# Patient Record
Sex: Male | Born: 1985 | Hispanic: No | Marital: Married | Smoking: Never smoker
Health system: Southern US, Community
[De-identification: ages and names within clinical notes are randomized; demographics above are authoritative.]

---
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
## 2008-08-02 ENCOUNTER — Ambulatory Visit: Payer: Self-pay | Admitting: Gynecology

## 2013-03-09 ENCOUNTER — Inpatient Hospital Stay: Payer: Self-pay | Admitting: Internal Medicine

## 2013-03-09 LAB — CBC WITH DIFFERENTIAL/PLATELET
Basophil #: 0 10*3/uL (ref 0.0–0.1)
Eosinophil #: 0.1 10*3/uL (ref 0.0–0.7)
Eosinophil %: 1 %
HGB: 13.9 g/dL (ref 13.0–18.0)
Lymphocyte #: 0.5 10*3/uL — ABNORMAL LOW (ref 1.0–3.6)
Lymphocyte %: 4.3 %
MCH: 30.1 pg (ref 26.0–34.0)
MCHC: 33.4 g/dL (ref 32.0–36.0)
Monocyte %: 3 %
Neutrophil #: 10.5 10*3/uL — ABNORMAL HIGH (ref 1.4–6.5)
Neutrophil %: 91.6 %
RBC: 4.62 10*6/uL (ref 4.40–5.90)
RDW: 12.9 % (ref 11.5–14.5)

## 2013-03-09 LAB — BASIC METABOLIC PANEL: BUN: 17 mg/dL (ref 7–18)

## 2013-03-10 LAB — BASIC METABOLIC PANEL
BUN: 15 mg/dL (ref 7–18)
Calcium, Total: 8.5 mg/dL (ref 8.5–10.1)
Chloride: 104 mmol/L (ref 98–107)
Creatinine: 1.11 mg/dL (ref 0.60–1.30)
EGFR (African American): >60
Glucose: 95 mg/dL (ref 65–99)
Osmolality: 273 (ref 275–301)
Potassium: 3.8 mmol/L (ref 3.5–5.1)

## 2013-03-10 LAB — CBC WITH DIFFERENTIAL/PLATELET
Basophil %: 0.3 %
Eosinophil #: 0.3 10*3/uL (ref 0.0–0.7)
HGB: 13.3 g/dL (ref 13.0–18.0)
Lymphocyte #: 0.7 10*3/uL — ABNORMAL LOW (ref 1.0–3.6)
Lymphocyte %: 11 %
MCH: 30.6 pg (ref 26.0–34.0)
MCHC: 34.2 g/dL (ref 32.0–36.0)
MCV: 89 fL (ref 80–100)
Neutrophil #: 5.3 10*3/uL (ref 1.4–6.5)
Neutrophil %: 80.7 %
RBC: 4.34 10*6/uL — ABNORMAL LOW (ref 4.40–5.90)
RDW: 12.8 % (ref 11.5–14.5)
WBC: 6.6 10*3/uL (ref 3.8–10.6)

## 2013-03-11 LAB — VANCOMYCIN, TROUGH: Vancomycin, Trough: 17 ug/mL (ref 10–20)

## 2013-03-15 LAB — CULTURE, BLOOD (SINGLE)

## 2015-03-20 NOTE — H&P (Signed)
DATE OF BIRTH:  March 22, 1986  DATE OF ADMISSION:  03/09/2013  PRIMARY CARE PROVIDER:  None.   ED REFERRING PHYSICIAN:  Dr. Marge Duncans  REASON FOR ADMISSION:  Right eye swelling, erythema around his right eye.   HISTORY OF PRESENT ILLNESS: The patient is a 29 year old white male who has a history of seasonal allergies. He reports that last Thursday evening he started having swelling around his right eye in the upper and lower eyelids. He also developed some erythema. He went to the walk-in clinic at [facility] and was prescribed Augmentin. He also received an IM shot, likely Rocephin. The patient's symptoms continued to get worse and the swelling continued to worsen. He again went back to [facility] yesterday, and was given again Rocephin and Bactrim was added to his regimen. His swelling continued to get worse. He comes to the ED now. The swelling has gotten worse around his orbits and he is not able to open his eyes completely. When he is able to open his eyelids, he is able to see fine. He does not have any eye pain or double vision. He has not had any fevers or chills. He otherwise has no other complaints.   PAST MEDICAL HISTORY:  Chronic seasonal allergies.   PAST SURGICAL HISTORY:  None.   ALLERGIES:  None.   CURRENT MEDICATIONS:  He is only on Claritin 10 once a day and the antibiotic that he was prescribed.   SOCIAL HISTORY:  Does not smoke. Drinks socially. No drugs.   FAMILY HISTORY:  Negative for hypertension or diabetes.   REVIEW OF SYSTEMS:    CONSTITUTIONAL:  Denies any fevers, fatigue, weakness, pain. No weight loss or weight gain.  EYES:  No blurred or double vision. No pain. No redness in the eye itself. Has inflammation around the eye. No glaucoma. No cataracts.  EARS, NOSE, THROAT: No tinnitus. No ear pain. No hearing loss. Does have seasonal allergies. No difficulty swallowing.  RESPIRATORY:  No cough. No wheezing. No hemoptysis.  CARDIOVASCULAR:  No chest  pain. No orthopnea. No edema. No arrhythmias.  GASTROINTESTINAL:  No nausea, vomiting, diarrhea. No abdominal pain. No hematemesis. No melena.  GENITOURINARY: Denies any dysuria, hematuria, renal calculus or frequency.  ENDOCRINOLOGIC:  Denies any polydpsia and polyphagia, nocturia, or thyroid problems.  HEMATOLOGIC/LYMPHATIC: Denies any easy bruisability or bleeding.  SKIN:  No acne. Complains of erythema around his right eye.  MUSCULOSKELETAL: No pain in neck, back or shoulder.  NEUROLOGIC:  No numbness. No CVA. No TIA. No seizures.  PSYCHIATRIC:  No anxiety. No insomnia. No ADD. No OCD.   PHYSICAL EXAMINATION: VITAL SIGNS:  Temperature 97.8, pulse 77, respirations 20, blood pressure 136/85, O2 is 98%.  GENERAL:  The patient is a well-developed, well-nourished male in no acute distress.  HEENT: Head atraumatic, normocephalic. Right eye: There is periorbital swelling and erythema. Pupils are equally round, react to light and accommodation. Visual confrontation is limited due to eye swelling, but he denies any blurred or double vision on examination. Nasal exam shows no drainage or ulceration. Oropharynx is clear, without any exudate.  NECK:  No thyromegaly. No carotid bruits.  CARDIOVASCULAR:  Regular rate and rhythm. No murmurs, rubs, clicks or gallops. PMI is not displaced.  LUNGS:  Clear to auscultation bilaterally without any rales, rhonchi, wheezing. No accessory muscle usage.  ABDOMEN:  Soft, nontender, nondistended. Positive bowel sounds x 4.  EXTREMITIES: No clubbing, cyanosis, edema.  SKIN:  Besides the erythema and swelling around his right  orbit, no other rash.  LYMPHATICS:  No lymph nodes palpable.  VASCULAR:  Good DP/ PT pulses.  PSYCHIATRIC:  Not anxious or depressed.  NEUROLOGIC:  Awake, alert, oriented x 3. No focal deficits.   LABS:  Glucose 143, BUN 17, creatinine 1.09, sodium 134, potassium 3.6, chloride 101, CO2 is 24. WBC 11.4, hemoglobin 13.9, platelet count 174.    ASSESSMENT AND PLAN:  The patient is a 29 year old with severe right periorbital cellulitis.   1. Periorbital cellulitis, which is severe at this time. I will treat him with Unasyn and vancomycin. Also obtain blood cultures.   2.  Hyperglycemia. Will check a hemoglobin A1c.    3.  Seasonal allergies. Will continue his Claritin as taking at home.   4.  Miscellaneous. Patient is ambulatory.    NOTE:  35 minutes spent.   ____________________________ Lacie ScottsShreyang H. Allena KatzPatel, MD shp:mr D: 03/09/2013 20:25:51 ET T: 03/09/2013 21:16:13 ET JOB#: [id number]  cc: Maximillian Habibi H. Allena KatzPatel, MD, <Dictator> Charise CarwinSHREYANG H Suvi Archuletta MD ELECTRONICALLY SIGNED 03/12/2013 20:32

## 2015-03-20 NOTE — Discharge Summary (Signed)
PATIENT NAME:  Derek Sutton, Derek Sutton MR#:  [id number] DATE OF BIRTH:  [birth year]  DATE OF ADMISSION:  03/09/2013 DATE OF DISCHARGE:  03/11/2013   ADMITTING PHYSICIAN: Shreyang H. Allena KatzPatel, MD  DISCHARGING PHYSICIAN: Enid Baasadhika Charish Schroepfer, MD  PRIMARY CARE PHYSICIAN: None.   CONSULTATIONS IN THE HOSPITAL: None.   DISCHARGE DIAGNOSES:  1. Right eye periorbital cellulitis.  2. Perennial allergies.   DISCHARGE HOME MEDICATIONS:  1. Claritin 10 mg p.o. daily.  2. Norco 5/325 mg 1 tablet q.4 hours p.r.n. for pain.  3. Tobramycin ophthalmic ointment 1 drop to right eye every 4 hours.  4. Augmentin 875 mg 1 tablet p.o. b.i.d. for 7 days.  5. Bactrim double strength 1 tablet p.o. t.i.d. for 7 days.   DISCHARGE DIET: Regular diet.   DISCHARGE ACTIVITY: As tolerated.   FOLLOW-UP INSTRUCTIONS: Advised to follow up at urgent care if swelling of the right eye does not improve. Advised to apply ice pack as tolerated.   LABS AND IMAGING STUDIES PRIOR TO DISCHARGE:  1. WBC 6.6, hemoglobin 13.3, hematocrit 38.8, platelet count 159.  2. Sodium 136, potassium 3.8, chloride 104, bicarbonate 27, BUN 15, creatinine 1.1, glucose 95 and calcium of 8.5. HbA1c is 5.1.  3. Blood cultures are negative.   BRIEF HOSPITAL COURSE: The patient is a pleasant 29 year old Caucasian male with no past medical history, who presented to the urgent care a couple of days prior to admission to the hospital secondary to redness around the right eye. He had like a small pustule close to the medial canthus of the right eye which he was rubbing and probably spread the infection. He was started on Augmentin twice a day. He did take it for a day and had worsening of symptoms, went back to urgent care and was given Bactrim as well. He took 1 dose of Bactrim, but he was concerned since his face was swollen and eye was closed and came to the hospital.   Right eye periorbital and facial cellulitis. Eye was closed shut because of the swelling and  also had mild conjunctival irritation as well. He was started on vancomycin and Unasyn while in the hospital. White count was normal. His symptoms improved after 48 hours of IV antibiotics and is being discharged on Bactrim and Augmentin to cover for the common organisms, including MRSA. His facial swelling still persisted beneath the eye, but his upper eyelid swelling has improved, and conjunctival erythema has cleared. He was also started on tobramycin eyedrops for the same. Since he does not have a PCP, he was advised to apply ice as needed and practice proper hygiene and not irritate the eye. If his clinical condition worsens or changes, advised to go back to the urgent care. His course has been otherwise uneventful in the hospital.   TIME SPENT ON DISCHARGE: 40 minutes.  ____________________________ Enid Baasadhika Jakara Blatter, MD rk:OSi D: 03/12/2013 12:34:54 ET T: 03/12/2013 12:45:44 ET JOB#: [id number]  cc: Enid Baasadhika Dontavis Tschantz, MD, <Dictator> Enid BaasADHIKA Brandan Robicheaux MD ELECTRONICALLY SIGNED 03/15/2013 15:53

## 2015-08-05 ENCOUNTER — Other Ambulatory Visit: Payer: Self-pay | Admitting: Family Medicine

## 2015-08-05 ENCOUNTER — Ambulatory Visit
Admission: RE | Admit: 2015-08-05 | Discharge: 2015-08-05 | Disposition: A | Discharge status: Home / Self Care | Payer: 59 | Source: Ambulatory Visit | Attending: Family Medicine | Admitting: Family Medicine

## 2015-08-05 DIAGNOSIS — N50819 Testicular pain, unspecified: Secondary | ICD-10-CM

## 2015-08-05 DIAGNOSIS — N433 Hydrocele, unspecified: Secondary | ICD-10-CM | POA: Insufficient documentation

## 2015-08-05 DIAGNOSIS — N508 Other specified disorders of male genital organs: Secondary | ICD-10-CM | POA: Insufficient documentation

## 2015-09-09 IMAGING — US US ART/VEN ABD/PELV/SCROTUM DOPPLER LTD
1 series · 13 of 25 positions shown · non-contrast
Comparison: None.

CLINICAL DATA: Right testicular pain for 3 days. No known injury.
Initial encounter.

EXAM:
SCROTAL ULTRASOUND
DOPPLER ULTRASOUND OF THE TESTICLES
TECHNIQUE: Complete ultrasound examination of the testicles, epididymis, and
other scrotal structures was performed. Color and spectral Doppler
ultrasound were also utilized to evaluate blood flow to the
testicles.

[Series 1: us art/ven abd/pelv/scrotum doppler ltd · 0.08mm/px · 13 of 43 slices shown]
[im 1/43]
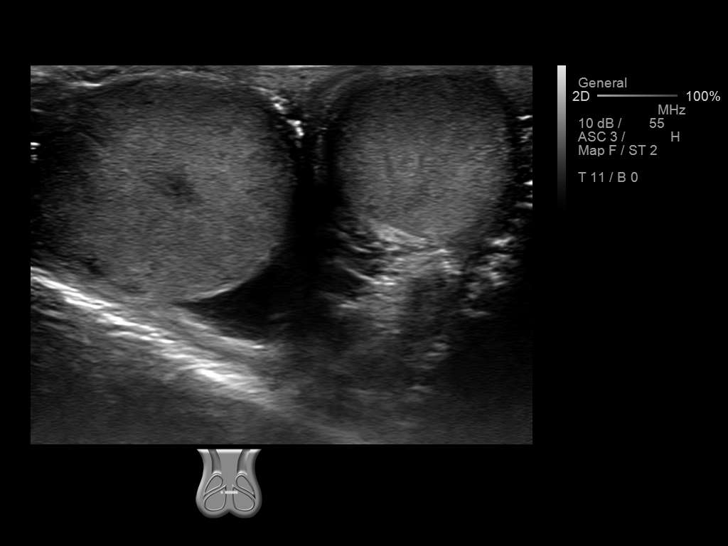
[im 4/43]
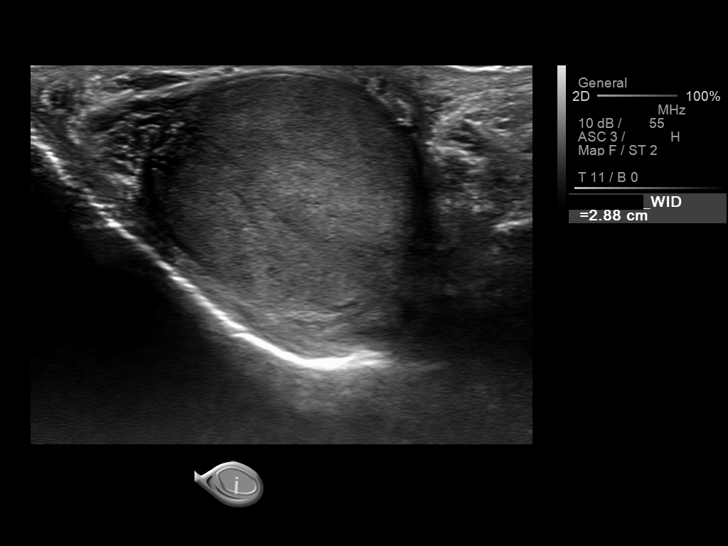
[im 8/43]
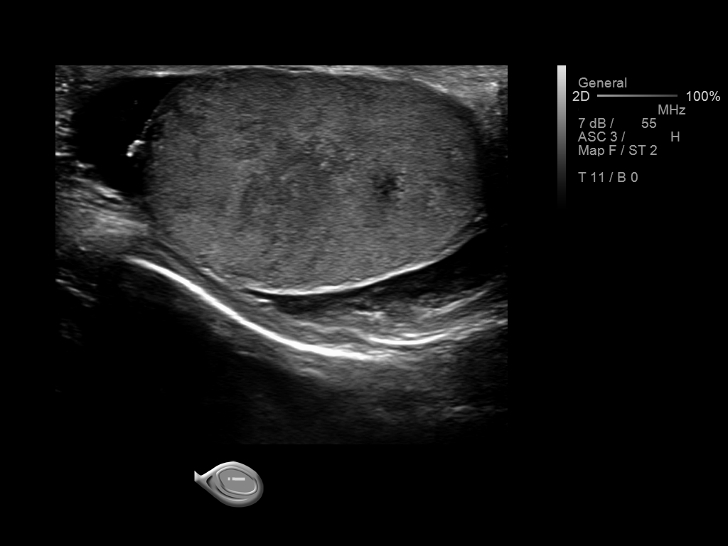
[im 11/43]
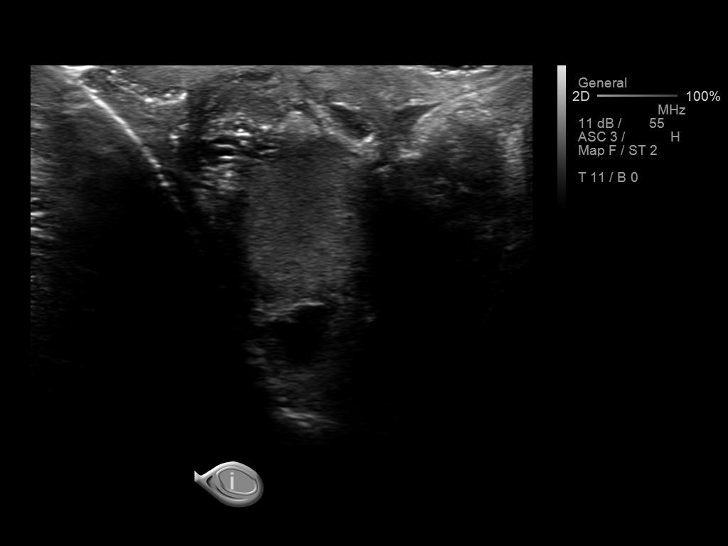
[im 15/43]
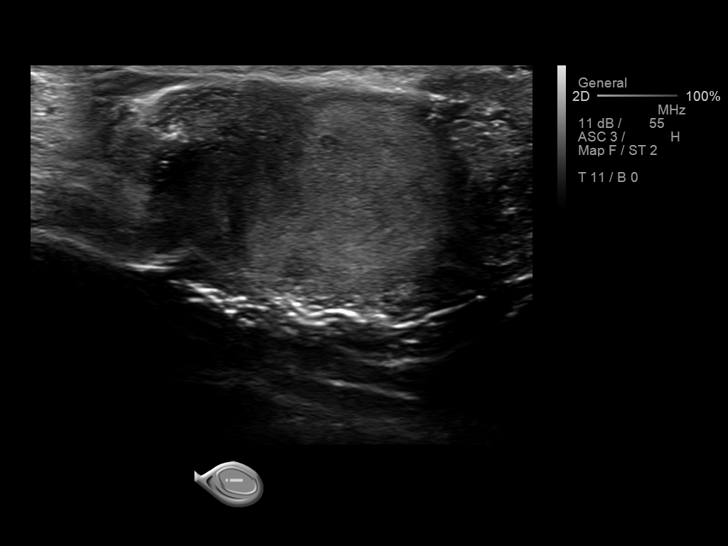
[im 18/43]
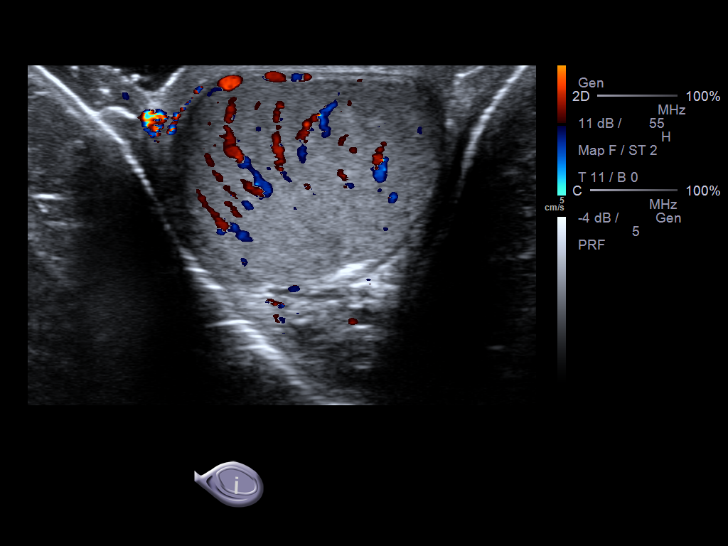
[im 22/43]
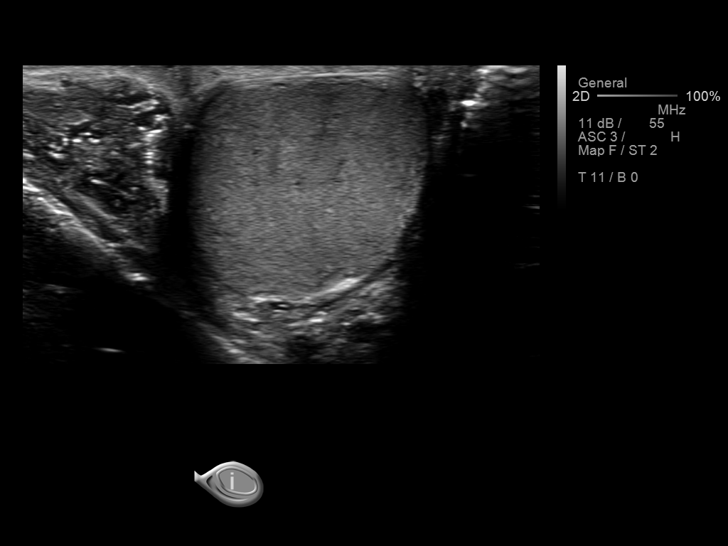
[im 25/43]
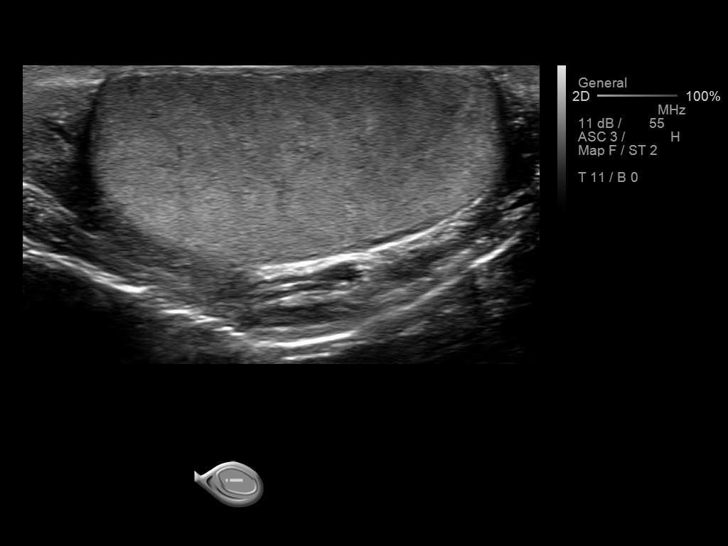
[im 29/43]
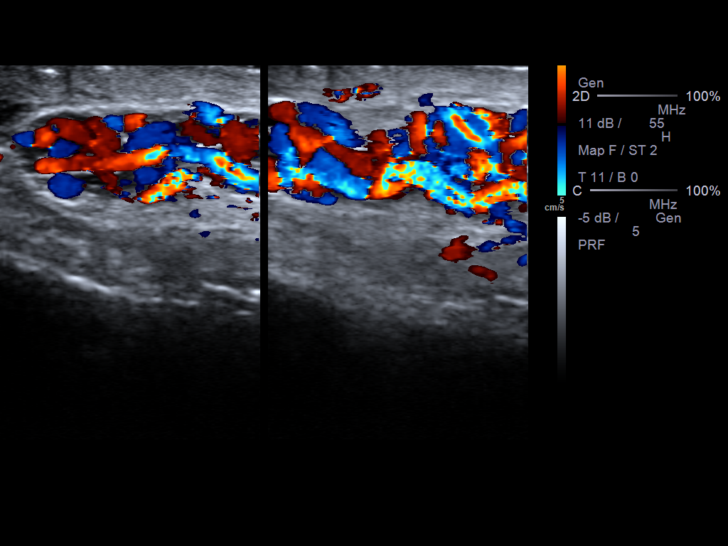
[im 32/43]
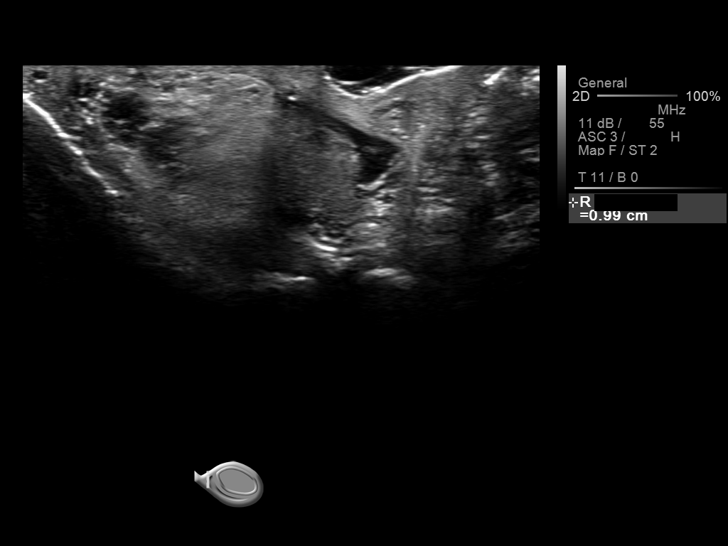
[im 36/43]
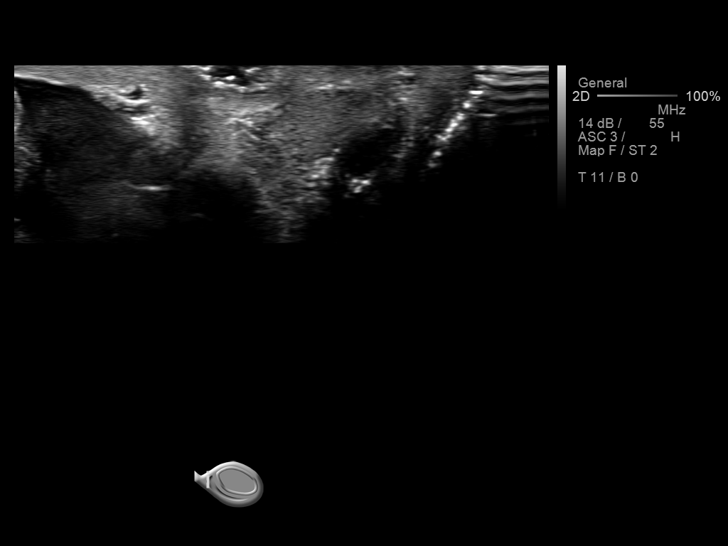
[im 39/43]
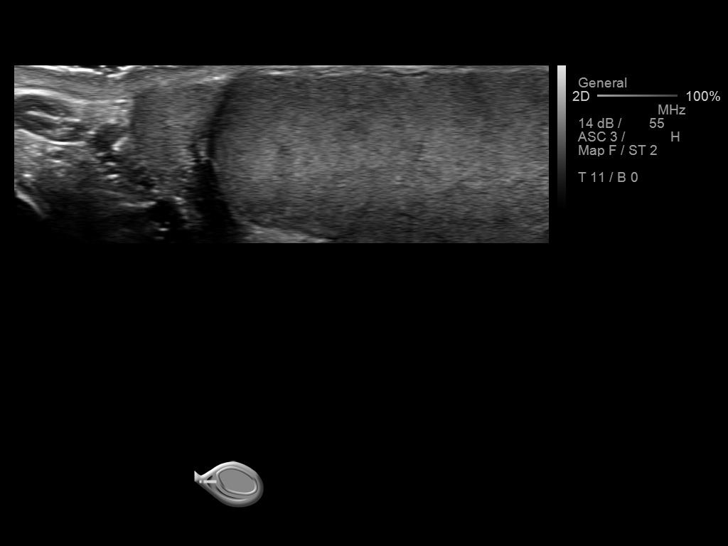
[im 43/43]
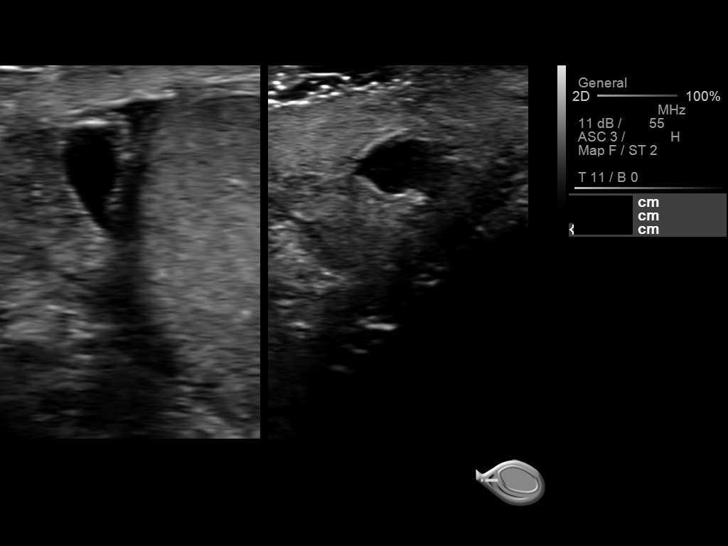

[13 of 25 positions shown; findings below may reference images not displayed]

FINDINGS: Right testicle

Measurements: 4.2 x 2.4 x 2.9 cm. The right testicle has a
heterogeneous appearance and is hyperemic relative to the left.

Left testicle

Measurements: 4.5 x 2.0 x 2.8 cm. No mass or microlithiasis
visualized.

Right epididymis: The right epididymis may be mildly hyperemic
relative to the left.

Left epididymis: Simple cyst in the left epididymis measures 0.6 cm
in diameter.

Hydrocele:  Small right hydrocele is seen.  No left hydrocele.

Varicocele:  None visualized.

Pulsed Doppler interrogation of both testes demonstrates normal low
resistance arterial and venous waveforms bilaterally.
IMPRESSION: Abnormal appearance of the right testicle is most consistent with
orchitis. There may also be epididymitis on the right. Associated
small right hydrocele is noted.

## 2016-10-04 ENCOUNTER — Encounter: Payer: Self-pay | Admitting: Emergency Medicine

## 2016-10-04 ENCOUNTER — Emergency Department
Admission: EM | Admit: 2016-10-04 | Discharge: 2016-10-04 | Disposition: A | Discharge status: Home / Self Care | Payer: 59 | Attending: Emergency Medicine | Admitting: Emergency Medicine

## 2016-10-04 ENCOUNTER — Emergency Department: Payer: 59

## 2016-10-04 DIAGNOSIS — M545 Low back pain: Secondary | ICD-10-CM | POA: Diagnosis present

## 2016-10-04 DIAGNOSIS — Z79899 Other long term (current) drug therapy: Secondary | ICD-10-CM | POA: Insufficient documentation

## 2016-10-04 DIAGNOSIS — Z791 Long term (current) use of non-steroidal anti-inflammatories (NSAID): Secondary | ICD-10-CM | POA: Diagnosis not present

## 2016-10-04 DIAGNOSIS — M5441 Lumbago with sciatica, right side: Secondary | ICD-10-CM | POA: Diagnosis not present

## 2016-10-04 DIAGNOSIS — M5442 Lumbago with sciatica, left side: Secondary | ICD-10-CM | POA: Diagnosis not present

## 2016-10-04 MED ORDER — MELOXICAM 15 MG PO TABS
15.0000 mg | ORAL_TABLET | Freq: Every day | ORAL | 0 refills | Status: AC
Start: 2016-10-04 — End: 2017-10-04

## 2016-10-04 MED ORDER — LIDOCAINE 5 % EX PTCH: 1.0000 | MEDICATED_PATCH | Freq: Two times a day (BID) | CUTANEOUS | 0 refills | Status: AC

## 2016-10-04 MED ORDER — LIDOCAINE 5 % EX PTCH
1.0000 | MEDICATED_PATCH | CUTANEOUS | Status: DC
  Administered 2016-10-04: 1 via TRANSDERMAL
  Filled 2016-10-04: qty 1

## 2016-10-04 NOTE — ED Triage Notes (Signed)
Patient ambulatory to triage with steady gait, without difficulty or distress noted; pt reports lower back pain with hx of same; st has a referral to an orthopedist but pain increased tonight

## 2016-10-04 NOTE — ED Notes (Signed)
Patient transported to X-ray at this time 

## 2016-10-04 NOTE — ED Provider Notes (Signed)
[facility] Emergency Department Provider Note   ____________________________________________   First MD Initiated Contact with Patient 10/04/16 0622     (approximate)  I have reviewed the triage vital signs and the nursing notes.   HISTORY  Chief Complaint Back Pain    HPI Derek Sutton is a 30 y.o. male who comes into the hospital today with some back pain. The patient has been having 6 weeks of low back pain. Initially the pain was intermittent but 3 weeks ago it became constant. 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 He saw his doctor and was given prednisone and naproxen but it did not really help. Over the weekend the pain seemed to get worse. It was radiating down both of his legs. The patient went to urgent care and was given some Vicodin and Flexeril. He was told to follow-up with orthopedic surgery but they have not yet called him. Tonight he woke up with pain around one. It was excruciating. He was able to fall back asleep and then woke up again at 4 and could not move. The patient took 2 Vicodin as well as a Flexeril before the pain would subside. The patient denies any trauma. At home his pain was a 10 out of 10 but currently his pain is a 2-3 out of 10 in intensity. The patient has no urinary incontinence no fecal incontinence no saddle anesthesia. The patient is concerned about what is going on and is here for evaluation.   History reviewed. No pertinent past medical history.  There are no active problems to display for this patient.   History reviewed. No pertinent surgical history.  Prior to Admission medications   Medication Sig Start Date End Date Taking? Authorizing Provider  lidocaine (LIDODERM) 5 % Place 1 patch onto the skin every 12 (twelve) hours. Remove & Discard patch within 12 hours or as directed by MD 10/04/16 10/04/17  Rebecka ApleyAllison P Webster, MD  meloxicam (MOBIC) 15 MG tablet Take 1 tablet  (15 mg total) by mouth daily. 10/04/16 10/04/17  Rebecka ApleyAllison P Webster, MD    Allergies Patient has no known allergies.  No family history on file.  Social History Social History  Substance Use Topics  . Smoking status: Never Smoker  . Smokeless tobacco: Never Used  . Alcohol use No    Review of Systems Constitutional: No fever/chills Eyes: No visual changes. ENT: No sore throat. Cardiovascular: Denies chest pain. Respiratory: Denies shortness of breath. Gastrointestinal: No abdominal pain.  No nausea, no vomiting.  No diarrhea.  No constipation. Genitourinary: Negative for dysuria. Musculoskeletal:  back pain. Skin: Negative for rash. Neurological: Negative for headaches, focal weakness or numbness.  10-point ROS otherwise negative.  ____________________________________________   PHYSICAL EXAM:  VITAL SIGNS: ED Triage Vitals  Enc Vitals Group     BP 10/04/16 0542 126/88     Pulse Rate 10/04/16 0542 71     Resp 10/04/16 0542 20     Temp 10/04/16 0542 97.4 F (36.3 C)     Temp Source 10/04/16 0542 Oral     SpO2 10/04/16 0542 97 %     Weight 10/04/16 0538 215 lb (97.5 kg)     Height 10/04/16 0538 6' (1.829 m)     Head Circumference --      Peak Flow --      Pain Score 10/04/16 0538 10     Pain Loc --  Pain Edu? --      Excl. in GC? --     Constitutional: Alert and oriented. Well appearing and in no acute distress. Eyes: Conjunctivae are normal. PERRL. EOMI. Head: Atraumatic. Nose: No congestion/rhinnorhea. Mouth/Throat: Mucous membranes are moist.  Oropharynx non-erythematous. Cardiovascular: Normal rate, regular rhythm. Grossly normal heart sounds.  Good peripheral circulation. Respiratory: Normal respiratory effort.  No retractions. Lungs CTAB. Gastrointestinal: Soft and nontender. No distention.Positive bowel sounds Musculoskeletal: No lower extremity tenderness nor edema.   Neurologic:  Normal speech and language.  Skin:  Skin is warm, dry and intact.    Psychiatric: Mood and affect are normal.   ____________________________________________   LABS (all labs ordered are listed, but only abnormal results are displayed)  Labs Reviewed - No data to display ____________________________________________  EKG  none ____________________________________________  RADIOLOGY  Lumbar spine x-ray ____________________________________________   PROCEDURES  Procedure(s) performed: None  Procedures  Critical Care performed: No  ____________________________________________   INITIAL IMPRESSION / ASSESSMENT AND PLAN / ED COURSE  Pertinent labs & imaging results that were available during my care of the patient were reviewed by me and considered in my medical decision making (see chart for details).  This is a 30 year old male who comes into the hospital today with some back pain. The patient did take some medication prior to coming in this pain is improved. He was asking if there was any imaging to help figure out what was going on. I did inform him that since he does not have any weakness or saddle anesthesia we cannot do an MRI emergently but I can do an x-ray to evaluate for disc space narrowing or arthritis.  Clinical Course as of Oct 04 746  Tue Oct 04, 2016  [id number] Mild disc space narrowing L5-S1. Other disc spaces appear unremarkable. No fracture or spondylolisthesis   DG Lumbar Spine 2-3 Views [AW]    Clinical Course User Index [AW] Rebecka ApleyAllison P Webster, MD   The patient's x-ray shows some mild disc space narrowing at L5-S1. He is still not in any severe pain at this time. I will give the patient a Lidoderm patch and I will also give him the name for our orthopedic surgeon on-call. I informed the patient that he should follow up with orthopedic surgery for further evaluation. The patient otherwise has no further complaints or concerns. He'll be discharged home to follow-up.  ____________________________________________   FINAL  CLINICAL IMPRESSION(S) / ED DIAGNOSES  Final diagnoses:  Acute midline low back pain with bilateral sciatica      NEW MEDICATIONS STARTED DURING THIS VISIT:  New Prescriptions   LIDOCAINE (LIDODERM) 5 %    Place 1 patch onto the skin every 12 (twelve) hours. Remove & Discard patch within 12 hours or as directed by MD   MELOXICAM (MOBIC) 15 MG TABLET    Take 1 tablet (15 mg total) by mouth daily.     Note:  This document was prepared using Dragon voice recognition software and may include unintentional dictation errors.    Rebecka ApleyAllison P Webster, MD 10/04/16 [phone]

## 2017-11-15 IMAGING — CR DG LUMBAR SPINE 2-3V
1 series · 3 of 3 positions shown · non-contrast
Comparison: None.

CLINICAL DATA: Lumbago with progressive pain. Radicular symptoms in
both lower extremities.

EXAM:
LUMBAR SPINE - 2-3 VIEW

[Series 1: dg lumbar spine 2-3 views · 0.14mm/px · 3 of 3 slices shown]
[im 1/3]
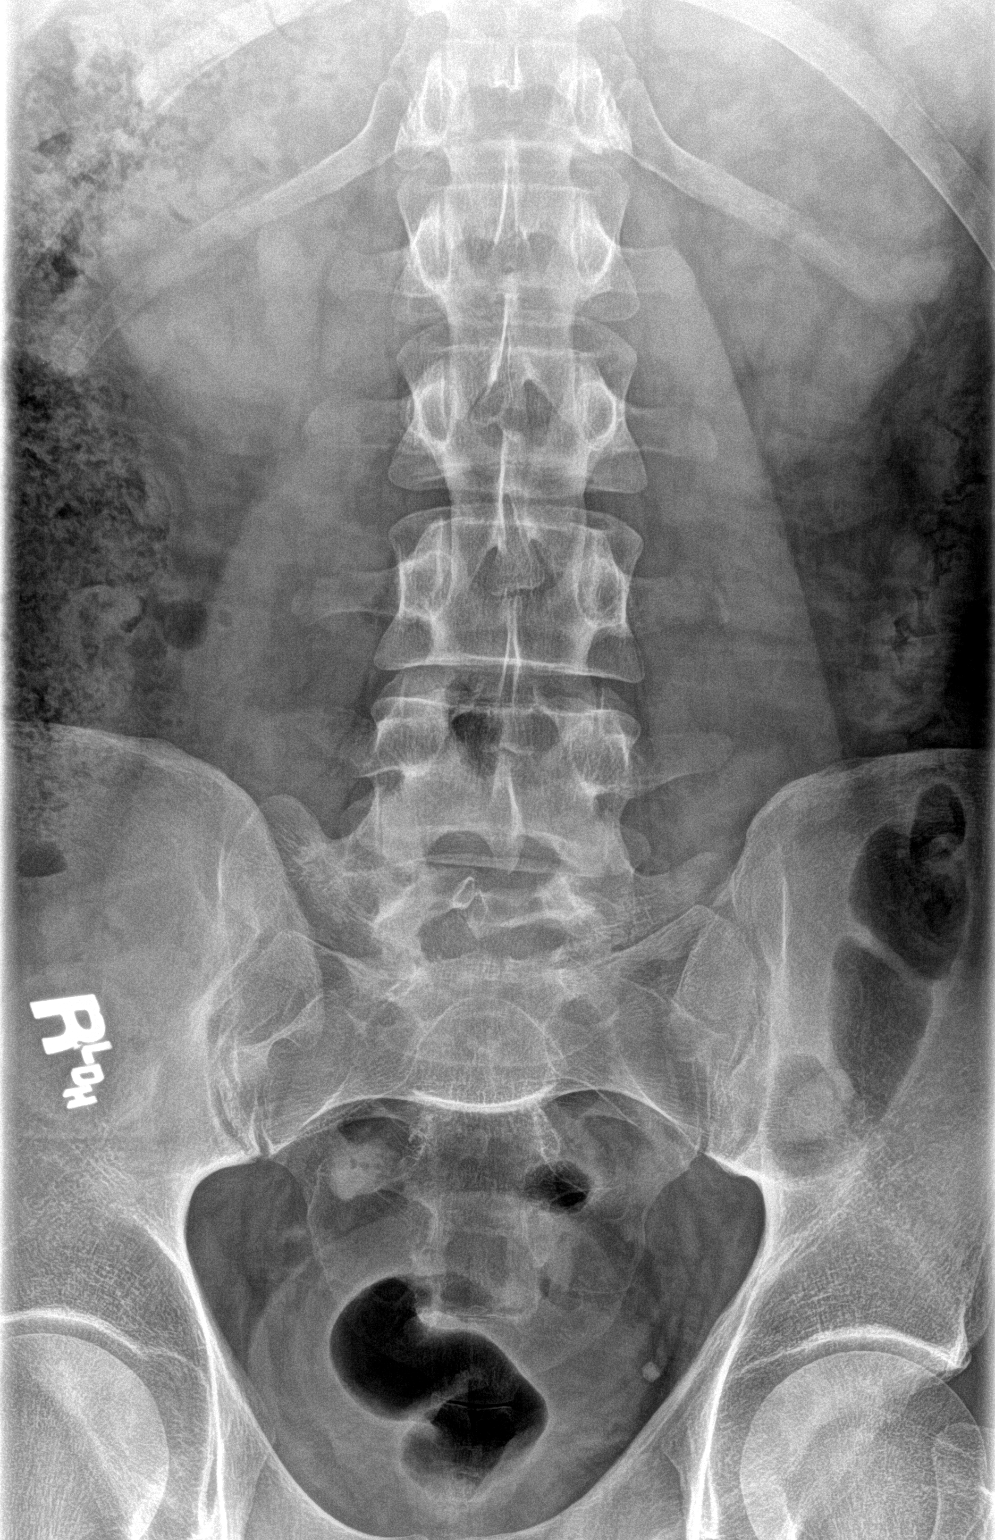
[im 2/3]
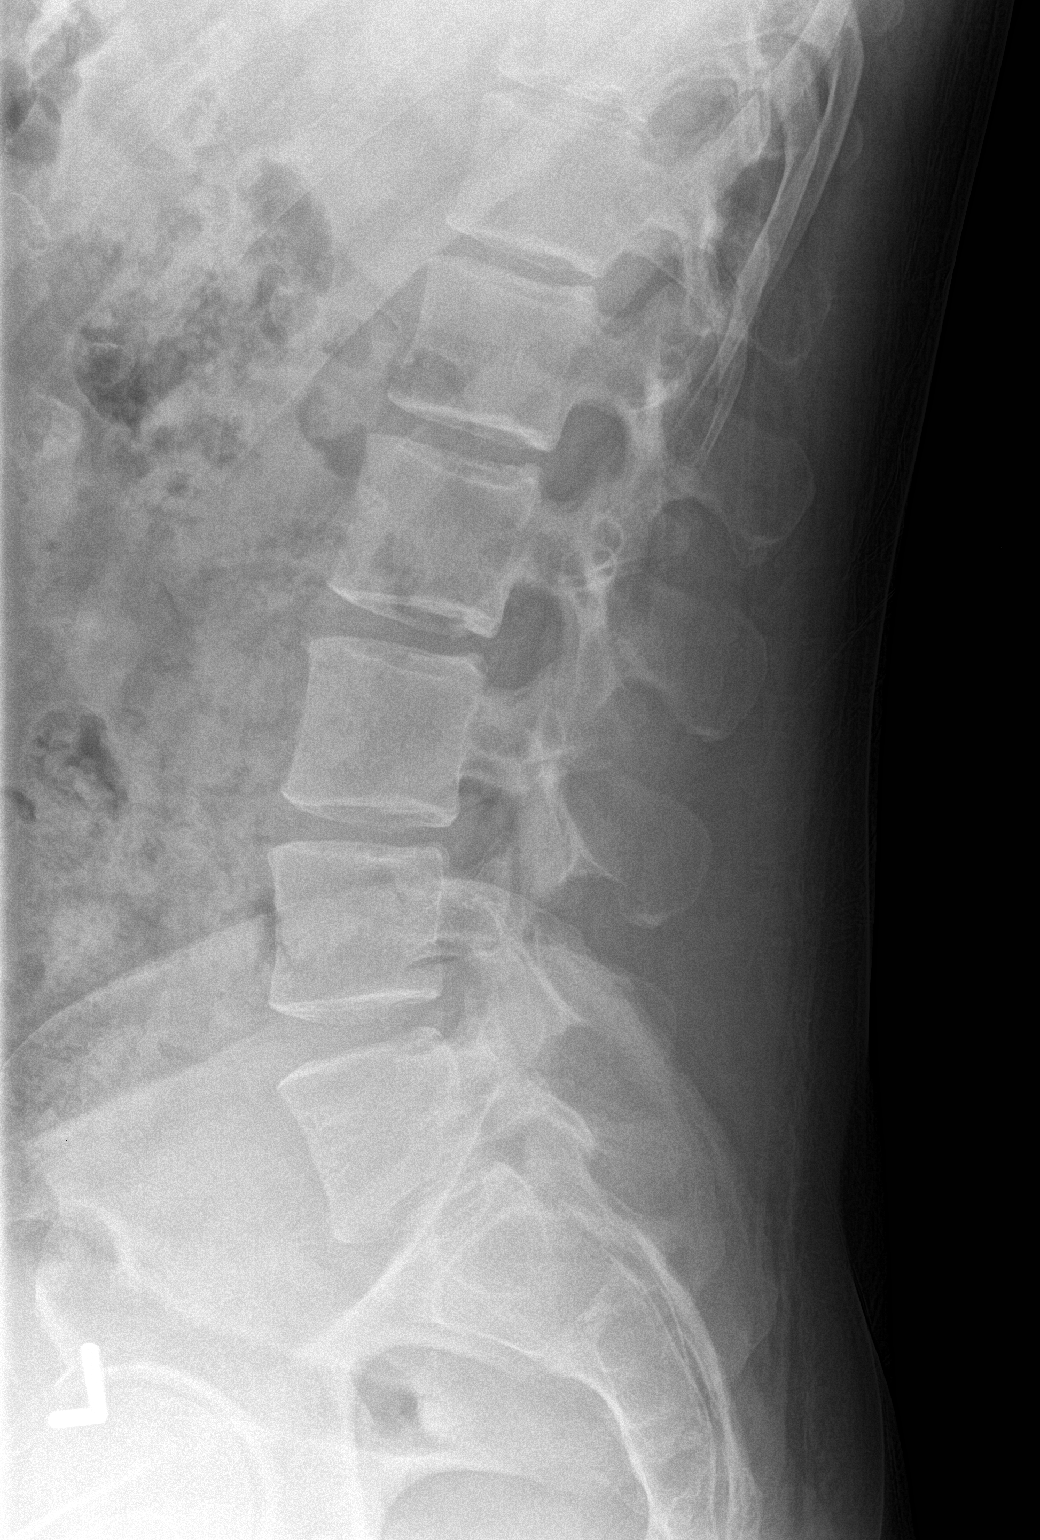
[im 3/3]
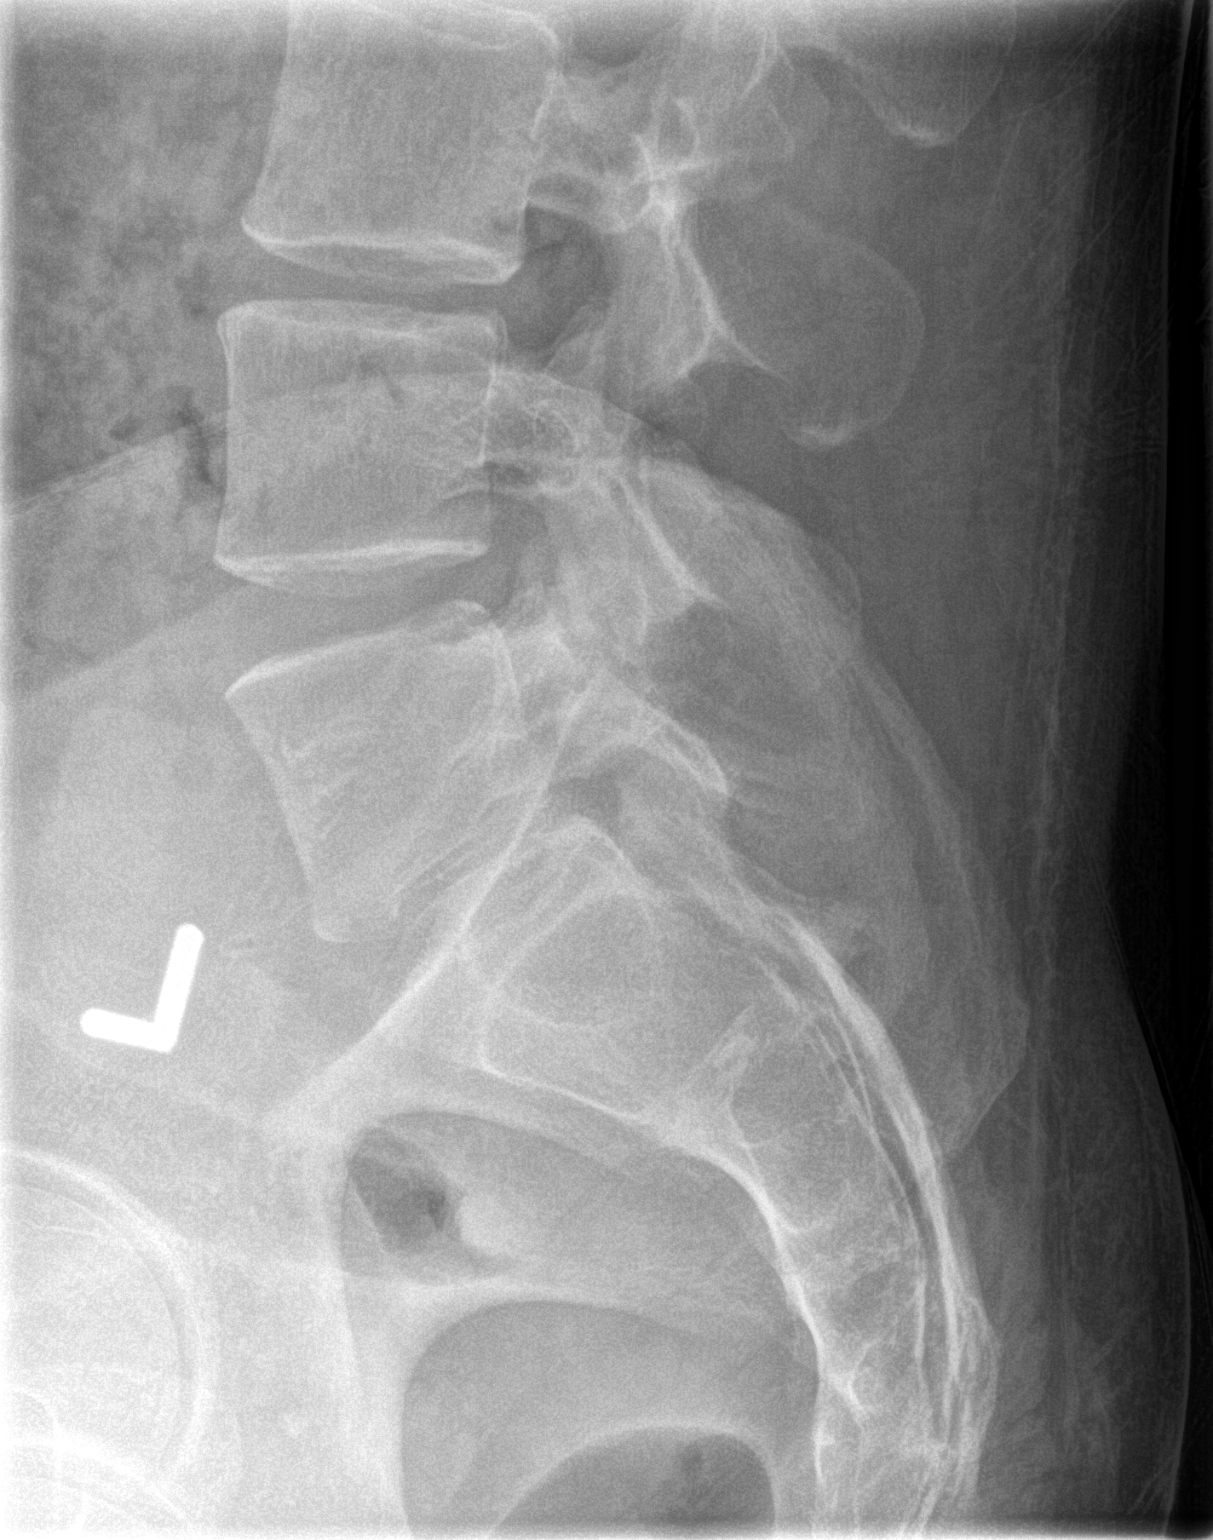

[3 of 3 positions shown; findings below may reference images not displayed]

FINDINGS: Frontal, lateral, and spot lumbosacral lateral images obtained.
There are 4 strictly non-rib-bearing lumbar type vertebral bodies.
The L5 vertebra appears somewhat transitional with attempted
assimilation joints at L5-S1 bilaterally. There is no fracture or
spondylolisthesis. There is slight disc space narrowing at L5-S1.
Other disc spaces appear normal. No erosive change. There is spina
bifida occulta at L5.
IMPRESSION: Mild disc space narrowing L5-S1. Other disc spaces appear
unremarkable. No fracture or spondylolisthesis.
